# Patient Record
Sex: Male | Born: 1990
Health system: Southern US, Community
[De-identification: ages and names within clinical notes are randomized; demographics above are authoritative.]

## PROBLEM LIST (undated history)

## (undated) DIAGNOSIS — S86019A Strain of unspecified Achilles tendon, initial encounter: Secondary | ICD-10-CM

## (undated) DIAGNOSIS — J45909 Unspecified asthma, uncomplicated: Secondary | ICD-10-CM

## (undated) DIAGNOSIS — I10 Essential (primary) hypertension: Secondary | ICD-10-CM

## (undated) HISTORY — DX: Essential (primary) hypertension: I10

## (undated) HISTORY — DX: Unspecified asthma, uncomplicated: J45.909

## (undated) HISTORY — PX: NO PAST SURGERIES: SHX2092

---

## 2014-11-06 ENCOUNTER — Ambulatory Visit (INDEPENDENT_AMBULATORY_CARE_PROVIDER_SITE_OTHER): Payer: Self-pay | Admitting: Physician Assistant

## 2014-11-06 VITALS — BP 110/82 | HR 68 | Temp 98.0°F | Resp 17 | Ht 69.0 in | Wt 162.2 lb

## 2014-11-06 DIAGNOSIS — Z7689 Persons encountering health services in other specified circumstances: Secondary | ICD-10-CM

## 2014-11-06 DIAGNOSIS — Z8739 Personal history of other diseases of the musculoskeletal system and connective tissue: Secondary | ICD-10-CM

## 2014-11-06 DIAGNOSIS — Z0000 Encounter for general adult medical examination without abnormal findings: Secondary | ICD-10-CM
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
## 2015-09-02 ENCOUNTER — Emergency Department (HOSPITAL_COMMUNITY): Payer: Self-pay

## 2015-09-02 ENCOUNTER — Emergency Department (HOSPITAL_COMMUNITY)
Admission: EM | Admit: 2015-09-02 | Discharge: 2015-09-02 | Disposition: A | Discharge status: Home / Self Care | Payer: Self-pay | Attending: Emergency Medicine | Admitting: Emergency Medicine

## 2015-09-02 ENCOUNTER — Encounter (HOSPITAL_COMMUNITY): Payer: Self-pay | Admitting: Emergency Medicine

## 2015-09-02 DIAGNOSIS — J029 Acute pharyngitis, unspecified: Secondary | ICD-10-CM

## 2015-09-02 DIAGNOSIS — J45901 Unspecified asthma with (acute) exacerbation: Secondary | ICD-10-CM | POA: Insufficient documentation

## 2015-09-02 MED ORDER — DEXAMETHASONE SODIUM PHOSPHATE 10 MG/ML IJ SOLN: 10.0000 mg | Freq: Once | INTRAMUSCULAR | Status: DC

## 2015-09-02 MED ORDER — DEXAMETHASONE SODIUM PHOSPHATE 10 MG/ML IJ SOLN
10.0000 mg | Freq: Once | INTRAMUSCULAR | Status: DC
  Filled 2015-09-02: qty 1

## 2015-09-02 NOTE — Discharge Instructions (Signed)
1. Medications: usual home medications 2. Treatment: rest, drink plenty of fluids, take tylenol or ibuprofen for sore throat and fever control 3. Follow Up: Please followup with your primary doctor in 3 days for discussion of your diagnoses and further evaluation after today's visit; if you do not have a primary care doctor use the resource guide provided to find one; Return to the ER for high fevers, difficulty breathing or other concerning symptoms    Pharyngitis Pharyngitis is redness, pain, and swelling (inflammation) of your pharynx.  CAUSES  Pharyngitis is usually caused by infection. Most of the time, these infections are from viruses (viral) and are part of a cold. However, sometimes pharyngitis is caused by bacteria (bacterial). Pharyngitis can also be caused by allergies. Viral pharyngitis may be spread from person to person by coughing, sneezing, and personal items or utensils (cups, forks, spoons, toothbrushes). Bacterial pharyngitis may be spread from person to person by more intimate contact, such as kissing.  SIGNS AND SYMPTOMS  Symptoms of pharyngitis include:   Sore throat.   Tiredness (fatigue).   Low-grade fever.   Headache.  Joint pain and muscle aches.  Skin rashes.  Swollen lymph nodes.  Plaque-like film on throat or tonsils (often seen with bacterial pharyngitis). DIAGNOSIS  Your health care provider will ask you questions about your illness and your symptoms. Your medical history, along with a physical exam, is often all that is needed to diagnose pharyngitis. Sometimes, a rapid strep test is done. Other lab tests may also be done, depending on the suspected cause.  TREATMENT  Viral pharyngitis will usually get better in 3-4 days without the use of medicine. Bacterial pharyngitis is treated with medicines that kill germs (antibiotics).  HOME CARE INSTRUCTIONS   Drink enough water and fluids to keep your urine clear or pale yellow.   Only take  over-the-counter or prescription medicines as directed by your health care provider:   If you are prescribed antibiotics, make sure you finish them even if you start to feel better.   Do not take aspirin.   Get lots of rest.   Gargle with 8 oz of salt water ( tsp of salt per 1 qt of water) as often as every 1-2 hours to soothe your throat.   Throat lozenges (if you are not at risk for choking) or sprays may be used to soothe your throat. SEEK MEDICAL CARE IF:   You have large, tender lumps in your neck.  You have a rash.  You cough up green, yellow-brown, or bloody spit. SEEK IMMEDIATE MEDICAL CARE IF:   Your neck becomes stiff.  You drool or are unable to swallow liquids.  You vomit or are unable to keep medicines or liquids down.  You have severe pain that does not go away with the use of recommended medicines.  You have trouble breathing (not caused by a stuffy nose). MAKE SURE YOU:   Understand these instructions.  Will watch your condition.  Will get help right away if you are not doing well or get worse.   This information is not intended to replace advice given to you by your health care provider. Make sure you discuss any questions you have with your health care provider.   Document Released: 07/18/2005 Document Revised: 05/08/2013 Document Reviewed: 03/25/2013 Elsevier Interactive Patient Education 2016 [employer].    Emergency Department Resource Guide 1) Find a Doctor and Pay Out of Pocket Although you won't have to find out who is covered by  your insurance plan, it is a good idea to ask around and get recommendations. You will then need to call the office and see if the doctor you have chosen will accept you as a new patient and what types of options they offer for patients who are self-pay. Some doctors offer discounts or will set up payment plans for their patients who do not have insurance, but you will need to ask so you aren't surprised when you  get to your appointment.  2) Contact Your Local Health Department Not all health departments have doctors that can see patients for sick visits, but many do, so it is worth a call to see if yours does. If you don't know where your local health department is, you can check in your phone book. The CDC also has a tool to help you locate your state's health department, and many state websites also have listings of all of their local health departments.  3) Find a Walk-in Clinic If your illness is not likely to be very severe or complicated, you may want to try a walk in clinic. These are popping up all over the country in pharmacies, drugstores, and shopping centers. They're usually staffed by nurse practitioners or physician assistants that have been trained to treat common illnesses and complaints. They're usually fairly quick and inexpensive. However, if you have serious medical issues or chronic medical problems, these are probably not your best option.  No Primary Care Doctor: - Call Health Connect at  [phone] - they can help you locate a primary care doctor that  accepts your insurance, provides certain services, etc. - Physician Referral Service- [phone]  Chronic Pain Problems: Organization         Address  Phone   Notes  [facility]  [phone] Patients need to be referred by their primary care doctor.   Medication Assistance: Organization         Address  Phone   Notes  [facility] Med[facility] [address] [place] [place], [place] [address] [phone] --Must be a resident of [facility] -- Must have NO insurance coverage whatsoever (no Medicaid/ Medicare, etc.) -- The pt. MUST have a primary care doctor that directs their care regularly and follows them in the community   [facility]  [phone]   [employer]  [phone]    Agencies that provide inexpensive medical care: Organization         Address  Phone    Notes  [facility]  [phone]   [facility]    [phone]   [facility] [address] [place], [place] [address] [phone]   [facility] [address], [place] [phone]   Planned Parenthood    [phone]   [facility]    [phone]   [facility]  [address], [place] Phone:  [phone], Fax:  [phone] Hours of Operation:  9 am - 6 pm, M-F.  Also accepts Medicaid/Medicare and self-pay.  [facility]  [address], [place] Phone: [phone], Fax: [phone]. Hours of Operation:  8:30 am - 5:30 pm, M-F.  Also accepts Medicaid and self-pay.  [facility] [place] [address], [place] Phone: [phone]   [facility] [address] Natasha Bence [place], [place] [phone], Ext. [phone] Mondays &  Thursdays: 7-9 AM.  First 15 patients are seen on a first come, first serve basis.    Medicaid-accepting [facility] Providers:  Organization         Address  Phone   Notes  [facility] [address], [place] [phone] Also accepts self-pay patients.  [facility] [address] Laurell Josephs [place], [place]  [phone]   [facility] [address], [place] [phone]   [facility] [address], [place] [phone]   Renaye Rakers [address], [place]   [phone] Only accepts [place] Access [place] patients after they have their name applied to their card.   Self-Pay (no insurance) in [facility]:  Organization         Address  Phone   Notes  Sickle Cell Patients, [facility] Medicine [address] [place], [place] [phone]   [facility] [address] [place], [place] [phone]   [facility] [place]  [address], [place] [phone]   [facility]/Dr. Osei-Bonsu  [address], [place] or [address], [place] [phone] Phone number for both [place] and [place] locations is the same.  [facility] [address] [place] [phone]   [facility] [address], [place] or [address] [phone]   [facility] [address], [place] [phone], phone; [phone], fax Sees patients 1st and 3rd Saturday of every month.  Must not qualify for public or private insurance (i.e. Medicaid, Medicare, [place] Health Choice, Veterans' Benefits)  Household income should be no more than 200% of the poverty level The clinic cannot treat you if you are pregnant or think you are pregnant  Sexually transmitted diseases are not treated at the clinic.    Dental Care: Organization         Address  Phone  Notes  [facility] Department of [facility] [address] [place], [place] [phone] Accepts children up to age 20 who are enrolled in [place] or [place] Health Choice; pregnant women with a Medicaid card; and children who have applied for Medicaid or [place] Health Choice, but were declined, whose parents can pay a reduced fee at time of service.  [facility] Department of [facility]  [address], [place] [phone] Accepts children up to age 60 who are enrolled in [place] or [place] Health Choice; pregnant women with a Medicaid card; and children who have applied for Medicaid or [place] Health Choice, but were declined, whose parents can pay a reduced fee at time of service.  [facility]  [address] [place], [place] [phone] Patients are seen by appointment only. Walk-ins are not accepted. [facility] will see patients 77 years of age  and older. Monday - Tuesday (8am-5pm) Most Wednesdays (8:30-5pm) $30 per visit, cash only  [facility]  [address], [facility] [phone] Patients are seen by appointment only. Walk-ins are not accepted. [facility] will see patients 62 years of age and older. One Wednesday Evening (Monthly: Volunteer Based).  $30 per visit, cash only  [employer]  [phone] for adults; Children under  age 19, call Graduate Pediatric Dentistry at [phone]. Children aged 16-14, please call [phone] to request a pediatric application.  Dental services are provided in all areas of dental care including fillings, crowns and bridges, complete and partial dentures, implants, gum treatment, root canals, and extractions. Preventive care is also provided. Treatment is provided to both adults and children. Patients are selected via a lottery and there is often a waiting list.   [facility] [address], [place]  [phone] [email or web address]   [facility] [address] [place], [place] [phone], Ext. [phone] Second and Fourth Thursday of each month, opens at 6:30 AM; Clinic ends at 9 AM.  Patients are seen on a first-come first-served basis, and a limited number are seen during each clinic.   [facility]  [address] Ether Griffins [place], [place] [phone]   Eligibility Requirements You must have lived in [place], [place], or [place] counties for at least the last three months.   You cannot be eligible for state or federal sponsored [place], including [employer], [place], or [employer].   You generally cannot be eligible for healthcare insurance through your employer.    How to apply: Eligibility screenings are held every Tuesday and Wednesday afternoon from 1:00 pm until 4:00 pm. You do not need an appointment for the interview!  [facility] [address] [place], [place] [phone]   [facility] Health Department  [phone]   [facility] Health Department  [phone]   [facility]  [phone]    Behavioral Health Resources in the Community: Intensive Outpatient Programs Organization         Address  Phone  Notes  [facility] [address], [place], [place] [phone]   [facility] Outpatient [address] [place], [place] [phone]   [facility] [address], [place], [place]  [phone]   [facility] Health [address],  [place], [place] [phone] or [phone]   Substance Abuse Resources Organization         Address  Phone  Notes  [facility] Drug Services  [phone]   [facility]  [phone]   [facility] [place]  [phone]   Floydene Flock  [phone]   Residential & Outpatient Substance Abuse Program  [phone]   Psychological Services Organization         Address  Phone  Notes  [facility] Behavioral Health  [phone]   [facility] Services  [phone]   [facility] Mental Health [address] [place] [phone] or [phone]    Mobile Crisis Teams Organization         Address  Phone  Notes  [facility], Mobile Crisis Care Unit  [phone]   [facility]  [address] [place], [place] [phone]   Doristine Locks [address] [place] [phone]    Self-Help/Support Groups Organization         Address  Phone             Notes  [facility] [place] - variety of support groups  336- I7437963 Call for more information  Narcotics Anonymous (NA), [facility] [address], [employer] [place]  2 meetings at this location   Chief Executive Officer  Notes  [facility] [phone] [place]  Lynne Logan [place]  [phone]   [facility]  [address], [place] [address], [place], [place] [phone]   [facility] [address] [place], [place] [phone] Admissions: 8am-3pm M-F  [facility] [address]    [place], [place] [phone]   [facility] [address] [place], [place], [place] [phone]   [facility] [address]  [place], [place] [phone]   [facility] - Intensive Outpatient [address] Laurell Josephs [address], [place], [place] [phone]   [facility] ([facility] [address] [place].,  [place], [place] [phone] or [phone]   [facility] ([facility]) [address] [place], [place] [phone] Accepts Medicaid  [facility] [place] [address]  [place] [phone] Substance Abuse/Addiction Treatment   [facility] Organization         Address  Phone  Notes  [facility]  [phone]   Angie Fava, PhD [address] Ervin Knack [place], [place]   [phone] or [phone]   [facility] Behavioral   [address] [place], [place] [phone]   [facility] [address], [place], [place] [phone] Insurance/Medicaid/sponsorship through [facility] and Families [address]                                    [place], [place] [phone] Therapy/tele-psych/case  [facility] [address], [place] [phone]    Dr. Lolly Mustache  [phone]   [facility]  [employer] [facility] - Behavioral Health Services Dept. 1) [address] [place] 2) [address] [place] 3)  [address], [place] [phone]  9846113434   [facility] Child Abuse Hotline 878 111 1622 or [phone] (After Hours)

## 2015-09-02 NOTE — ED Notes (Signed)
Pt reports cold symptoms and sore throat x 2 days. Obvious  Red, inflamed and swollen tonsils. Pt reports shortness of breath, pt is hypertensive 149/108 . Pt does not take BP meds. Pt denies chest pain at this time.

## 2015-09-02 NOTE — ED Provider Notes (Signed)
CSN: [id number]     Arrival date & time 09/02/15  1714 History   First MD Initiated Contact with Patient 09/02/15 1724     Chief Complaint  Patient presents with  . Sore Throat  . Shortness of Breath     (Consider location/radiation/quality/duration/timing/severity/associated sxs/prior Treatment) The history is provided by the patient and medical records. No language interpreter was used.   Joseph Erickson is a 25 y.o. male  with a hx of asthma presents to the Emergency Department complaining of gradual, persistent, progressively worsening sore throat onset yesterday with onset of voice hoarseness this morning. Associated symptoms include intermittent cough and shortness of breath. No aggravating or alleviating factors. Patient reports that he feels the need to clear his throat often.  Pt denies fever, chills, difficult swallowing, neck pain, chest pain, wheezing, abdominal pain, nausea, vomiting, diarrhea, weakness, dizziness, syncope.  Patient reports he is up-to-date on his vaccines.   Past Medical History  Diagnosis Date  . Asthma    History reviewed. No pertinent past surgical history. No family history on file. Social History  Substance Use Topics  . Smoking status: Never Smoker   . Smokeless tobacco: Never Used  . Alcohol Use: 0.0 oz/week    0 Standard drinks or equivalent per week     Comment: rarely    Review of Systems  Constitutional: Negative for fever, diaphoresis, appetite change, fatigue and unexpected weight change.  HENT: Positive for sore throat. Negative for mouth sores.   Eyes: Negative for visual disturbance.  Respiratory: Negative for cough, chest tightness, shortness of breath and wheezing.   Cardiovascular: Negative for chest pain.  Gastrointestinal: Negative for nausea, vomiting, abdominal pain, diarrhea and constipation.  Endocrine: Negative for polydipsia, polyphagia and polyuria.  Genitourinary: Negative for dysuria, urgency, frequency and hematuria.   Musculoskeletal: Negative for back pain and neck stiffness.  Skin: Negative for rash.  Allergic/Immunologic: Negative for immunocompromised state.  Neurological: Negative for syncope, light-headedness and headaches.  Hematological: Does not bruise/bleed easily.  Psychiatric/Behavioral: Negative for sleep disturbance. The patient is not nervous/anxious.       Allergies  Review of patient's allergies indicates no known allergies.  Home Medications   Prior to Admission medications   Not on File   BP 134/88 mmHg  Pulse 78  Temp(Src) 98.1 F (36.7 C) (Oral)  Resp 20  SpO2 99% Physical Exam  Constitutional: He appears well-developed and well-nourished. No distress.  HENT:  Head: Normocephalic and atraumatic.  Right Ear: Tympanic membrane, external ear and ear canal normal.  Left Ear: Tympanic membrane, external ear and ear canal normal.  Nose: Nose normal. No mucosal edema or rhinorrhea.  Mouth/Throat: Uvula is midline and mucous membranes are normal. Mucous membranes are not dry. No trismus in the jaw. No uvula swelling. Posterior oropharyngeal erythema present. No oropharyngeal exudate, posterior oropharyngeal edema or tonsillar abscesses.  Posterior oropharynx with erythema but no edema or exudate on the tonsils Scattered vesicles along the posterior oropharynx without petechiae Epiglottis is visible but is without erythema or swelling  Eyes: Conjunctivae are normal.  Neck: Normal range of motion, full passive range of motion without pain and phonation normal. No tracheal tenderness, no spinous process tenderness and no muscular tenderness present. No rigidity. No erythema and normal range of motion present. No Brudzinski's sign and no Kernig's sign noted.  Range of motion without pain  No midline or paraspinal tenderness Hoarse phonation No stridor Handling secretions without difficulty No nuchal rigidity or meningeal signs  Cardiovascular: Normal  rate, regular rhythm,  normal heart sounds and intact distal pulses.   Pulses:      Radial pulses are 2+ on the right side, and 2+ on the left side.  Pulmonary/Chest: Effort normal and breath sounds normal. No stridor. No respiratory distress. He has no decreased breath sounds. He has no wheezes.  Equal chest expansion, clear and equal breath sounds without focal wheezes, rhonchi or rales  Abdominal: Soft. Bowel sounds are normal. He exhibits no distension.  Musculoskeletal: Normal range of motion.  Lymphadenopathy:       Head (right side): Submandibular and tonsillar adenopathy present. No submental, no preauricular, no posterior auricular and no occipital adenopathy present.       Head (left side): Submandibular and tonsillar adenopathy present. No submental, no preauricular, no posterior auricular and no occipital adenopathy present.    He has no cervical adenopathy.       Right cervical: No superficial cervical, no deep cervical and no posterior cervical adenopathy present.      Left cervical: No superficial cervical, no deep cervical and no posterior cervical adenopathy present.  Neurological: He is alert.  Alert and oriented Moves all extremities without ataxia  Skin: Skin is warm and dry. He is not diaphoretic.  Psychiatric: He has a normal mood and affect.  Nursing note and vitals reviewed.   ED Course  Procedures (including critical care time)  Imaging Review Dg Neck Soft Tissue  09/02/2015  CLINICAL DATA:  Globus sensation in the throat.  Visible epiglottis. EXAM: NECK SOFT TISSUES - 1+ VIEW COMPARISON:  None. FINDINGS: There is no evidence of retropharyngeal soft tissue swelling or epiglottic enlargement. The cervical airway is unremarkable and no radio-opaque foreign body identified. IMPRESSION: Negative. Electronically Signed   By: Delbert Phenix M.D.   On: 09/02/2015 18:38   Dg Chest 2 View  09/02/2015  CLINICAL DATA:  Sore throat.  Shortness of breath.  Dry cough. EXAM: CHEST  2 VIEW COMPARISON:   None. FINDINGS: The heart size and mediastinal contours are within normal limits. Both lungs are clear. The visualized skeletal structures are unremarkable. IMPRESSION: No active cardiopulmonary disease. Electronically Signed   By: Gaylyn Rong M.D.   On: 09/02/2015 17:48   I have personally reviewed and evaluated these images and lab results as part of my medical decision-making.    MDM   Final diagnoses:  Viral pharyngitis  Sore throat   Joseph Erickson presents with sore throat.  Pt afebrile without tonsillar exudate, tonsillar edema. Presents with no cervical lymphadenopathy, & minimal dysphagia; diagnosis of viral pharyngitis. Full range of motion of the neck without pain. Highly doubt RPA.  Patient reports concern about visible epiglottis. It is visualized my exam but is not erythematous or edematous. Patient with hoarse voice but no hot potato voice or fold voice. Handling secretions without difficulty. Able to drink water in the emergency department.   Complaint some of the neck shows no enlargement of retropharyngeal soft tissue epiglottis. No abx indicated. DC w symptomatic tx for pain  Pt does not appear dehydrated, but did discuss importance of water rehydration. Presentation non concerning for PTA or infxn spread to soft tissue. No trismus or uvula deviation. Specific return precautions discussed. Pt able to drink water in ED without difficulty with intact air way. Recommended PCP follow up.  The patient was discussed with and seen by Dr. Adela Lank who agrees with the treatment plan.     Dahlia Client Eilyn Polack, PA-C 09/02/15 1922  Melene Plan, DO 09/02/15  2034 

## 2015-09-02 NOTE — ED Notes (Signed)
Patient transported to X-ray 

## 2015-09-24 ENCOUNTER — Encounter (HOSPITAL_COMMUNITY): Payer: Self-pay | Admitting: Emergency Medicine

## 2015-09-24 ENCOUNTER — Emergency Department (HOSPITAL_COMMUNITY)
Admission: EM | Admit: 2015-09-24 | Discharge: 2015-09-24 | Disposition: A | Discharge status: Home / Self Care | Payer: Self-pay | Attending: Emergency Medicine | Admitting: Emergency Medicine

## 2015-09-24 DIAGNOSIS — Y9289 Other specified places as the place of occurrence of the external cause: Secondary | ICD-10-CM | POA: Insufficient documentation

## 2015-09-24 DIAGNOSIS — S86012A Strain of left Achilles tendon, initial encounter: Secondary | ICD-10-CM | POA: Insufficient documentation

## 2015-09-24 DIAGNOSIS — J45909 Unspecified asthma, uncomplicated: Secondary | ICD-10-CM | POA: Insufficient documentation

## 2015-09-24 DIAGNOSIS — W500XXA Accidental hit or strike by another person, initial encounter: Secondary | ICD-10-CM | POA: Insufficient documentation

## 2015-09-24 DIAGNOSIS — Y9367 Activity, basketball: Secondary | ICD-10-CM | POA: Insufficient documentation

## 2015-09-24 DIAGNOSIS — Y998 Other external cause status: Secondary | ICD-10-CM | POA: Insufficient documentation

## 2015-09-24 MED ORDER — HYDROCODONE-ACETAMINOPHEN 5-325 MG PO TABS
1.0000 | ORAL_TABLET | Freq: Once | ORAL | Status: AC
  Administered 2015-09-24: 1 via ORAL
  Filled 2015-09-24: qty 1

## 2015-09-24 MED ORDER — IBUPROFEN 800 MG PO TABS
800.0000 mg | ORAL_TABLET | Freq: Once | ORAL | Status: DC
  Filled 2015-09-24: qty 1

## 2015-09-24 MED ORDER — HYDROCODONE-ACETAMINOPHEN 5-325 MG PO TABS: 1.0000 | ORAL_TABLET | Freq: Four times a day (QID) | ORAL | Status: DC | PRN

## 2015-09-24 NOTE — ED Provider Notes (Signed)
  Face-to-face evaluation   History: He injured his left ankle, when someone stepped on it as he was playing basketball.  Physical exam: Alert, calm, cooperative. Left ankle tender posteriorly with defect consistent with Achilles tendon rupture. Formal Thompson test is positive for Achilles rupture, left.  Medical screening examination/treatment/procedure(s) were conducted as a shared visit with non-physician practitioner(s) and myself.  I personally evaluated the patient during the encounter  Mancel Bale, MD 09/24/15 1737

## 2015-09-24 NOTE — Discharge Instructions (Signed)
Please call Dr. Eliberto Ivory office to schedule an appointment for Monday. In the meantime I will give you a prescription for Vicodin. If you do not need something so strong you may take the ibuprofen you have at home (  every 8 hours). Return to the ER for new or worsening symptoms. Complete Achilles Tendon Rupture An Achilles tendon rupture is an injury in which the tough, cord-like band that attaches the lower muscles of your leg to your heel (Achilles tendon) tears (ruptures). In a complete Achilles tendon rupture, you are not able to stand up on the toes of the injured leg. CAUSES A tendon may rupture if it is weakened or weakening (degenerative) and a sudden stress is applied to it. Weakening or degeneration of a tendon may be caused by:  Recurrent injuries, such as those causing Achilles tendinitis.  Damaged tendons.  Aging.  Vascular disease of the tendon. SIGNS AND SYMPTOMS  Feeling as if you were struck violently in the back of the ankle.  Hearing a "pop" and experiencing severe, sudden (acute) pain; however, the absence of pain does not mean there was not a rupture. DIAGNOSIS A physical exam is usually all that is needed to diagnose an Achilles tendon rupture. During the exam, your health care provider will touch the tendon and the structures around it. You may be asked to lie on your stomach or kneel on a chair while your health care provider squeezes your calf muscle. You most likely have a ruptured tendon if your foot does not flex.  Sometimes tests are performed. These may include:  An ultrasound. This allows quick confirmation of the diagnosis.  An X-ray.  An MRI. TREATMENT  A complete Achilles tendon rupture is treated with surgery. You will have a cast while the injury heals (this usually takes 6-10 weeks). Before your surgery, treatment may consist of:  Ice applied to the area.  Pain-relieving medicines.  Rest.  Crutches.  Keeping the ankle from moving  (immobilization), usually with a splint. HOME CARE INSTRUCTIONS   Apply ice to the injured area:   Put ice in a plastic bag.   Place a towel between your skin and the bag.   Leave the ice on for 20 minutes, 2-3 times a day.   Use crutches and move about only as instructed.   Keep the leg elevated above the level of the heart (the center of the chest) at all times when not using the bathroom. Do not dangle the leg over a chair, couch, or bed. When lying down, elevate your leg on a few pillows. Elevation prevents swelling and reduces pain.  Avoid use of the injured area other than gentle range of motion of the toes.  Do not drive a car until your health care provider specifically tells you it is safe to do so.  Take all medicines as directed by your health care provider.  Keep all follow-up visits as directed by your health care provider. SEEK MEDICAL CARE IF:   Your pain and swelling increase, or your pain is not controlled by medicines.   You have new, unexplained symptoms, or your symptoms get worse.  You cannot move your toes or foot.  You develop warmth and swelling in your foot.  You have an unexplained fever. MAKE SURE YOU:   Understand these instructions.  Will watch your condition.  Will get help right away if you are not doing well or get worse.   This information is not intended to replace advice given  to you by your health care provider. Make sure you discuss any questions you have with your health care provider.   Document Released: 04/27/2005 Document Revised: 12/02/2014 Document Reviewed: 03/08/2013 Elsevier Interactive Patient Education 2016 [employer]  Achilles Tendon Repair Your Achilles tendon is the strong, rope-like cord of tissue that connects your lower leg muscles to your heel. If the Achilles tendon tears (ruptures), you may have surgery to reconnect the ends of the tendon. This surgery is called Achilles tendon repair.  Achilles tendon  repair may be done at a hospital or at an outpatient surgery center. It takes from 30 minutes to 1 hour to complete. Most people who have the surgery are able to go home the same day as the surgery. The surgery is usually successful, but the recovery period can be long. LET [facility] CARE PROVIDER KNOW ABOUT:  Any allergies you have.  All medicines you are taking, including vitamins, herbs, eye drops, creams, and over-the-counter medicines.  Previous problems you or members of your family have had with the use of anesthetics.  Any blood disorders you have.  Previous surgeries you have had.  Medical conditions you have, including any skin conditions you develop before surgery. RISKS AND COMPLICATIONS Generally, this is a safe procedure. However, as with any procedure, problems can occur. Possible problems include:  Reactions to anesthetics.  Infection.  Bleeding.  Delayed healing.  Scarring.  Nerve damage causing numbness.  Re-rupture of your tendon (rare). BEFORE THE PROCEDURE  Discuss the risks and benefits of the surgery with your health care provider.  Ask your health care provider if you can take your regular medicines with a small sip of water on the morning of your surgery.  Be careful not to injure the skin on your leg.  Shower or bathe the night before the surgery or the morning of the surgery.  Do not eat or drink anything for 6-8 hours before your surgery or as directed by your health care provider.  If you smoke, quit before your surgery. Smoking makes it harder for your body to heal. PROCEDURE  A numbing medicine (local anesthetic) will be injected into the area around your ankle and lower leg.  Your lower leg will be cleaned and draped for surgery.  You will be given medicine that makes you sleep (general anesthetic).  The surgeon will make a cut (incision) over your Achilles tendon.  The torn ends of your tendon will be stitched back  together.  The incisions will be closed with stitches or staples.  A bandage will be applied. AFTER THE PROCEDURE  After the medicine wears off, you will be taken to the recovery area and monitored closely.  You will feel some pain when the numbing medicine wears off. Your health care provider will prescribe pain medicine for you to take at home.  Your leg may be put in a cast or splint.  Use crutches or another type of walking aid to keep weight off your leg.  You will be allowed to go home once you are awake, stable, able to drink, and have no other problems.   This information is not intended to replace advice given to you by your health care provider. Make sure you discuss any questions you have with your health care provider.   Document Released: 07/23/2013 Document Reviewed: 07/23/2013 Elsevier Interactive Patient Education [employer]

## 2015-09-24 NOTE — ED Notes (Signed)
Pt c/o left posterior leg injury, tenderness to calf on palpation, significant calf pain with dorsiflexion of foot. No point tenderness to knee, ankle, foot.

## 2015-09-24 NOTE — ED Provider Notes (Signed)
CSN: [id number]     Arrival date & time 09/24/15  1019 History   First MD Initiated Contact with Patient 09/24/15 1141     Chief Complaint  Patient presents with  . Leg Injury    HPI   Joseph Erickson is an 25 y.o. male with history of asthma who presents to the ED for evaluation of left leg injury sustained two hours PTA. Pt states he was playing basketball when he went for a rebound and one of his teammates kicked him accidentally in his posterior calcaneal region. States he had immediate onset sharp severe pain that radiated up his calf. He states he did not fall. States initially he was able to bear weight though it cause significant pain in his calf and states it felt "weird" like he was "walking on a downhill slope." In the ED now he cannot bear weight. He cannot plantar flex. He does still have dorsiflexion ability. States his pain is mostly in his calf and rates it 8-9/10. He has not tried anything for his symptoms at this time.   Past Medical History  Diagnosis Date  . Asthma    History reviewed. No pertinent past surgical history. History reviewed. No pertinent family history. Social History  Substance Use Topics  . Smoking status: Never Smoker   . Smokeless tobacco: Never Used  . Alcohol Use: 0.0 oz/week    0 Standard drinks or equivalent per week     Comment: rarely    Review of Systems  All other systems reviewed and are negative.     Allergies  Review of patient's allergies indicates no known allergies.  Home Medications   Prior to Admission medications   Not on File   BP 132/74 mmHg  Pulse 72  Temp(Src) 98 F (36.7 C) (Oral)  Resp 16  SpO2 100% Physical Exam  Constitutional: He is oriented to person, place, and time. No distress.  HENT:  Head: Atraumatic.  Right Ear: External ear normal.  Left Ear: External ear normal.  Nose: Nose normal.  Eyes: Conjunctivae are normal. No scleral icterus.  Neck: Normal range of motion. Neck supple.  Cardiovascular:  Normal rate and regular rhythm.   Pulmonary/Chest: Effort normal. No respiratory distress. He exhibits no tenderness.  Abdominal: Soft. He exhibits no distension. There is no tenderness.  Musculoskeletal:  Left calf diffusely TTP. There is a defect where achilles tendon should be in posterior calcaneal region. Thompson test positive (minimal plantar flexion) with pt prone on exam bed. Pt cannot actively plantar flex. He can dorsiflex. 2+ distal pulses and good cap refill. Cannot bear weight or ambulate unassisted.  Neurological: He is alert and oriented to person, place, and time.  Skin: Skin is warm and dry. He is not diaphoretic.  Psychiatric: He has a normal mood and affect. His behavior is normal.  Nursing note and vitals reviewed.   ED Course  Procedures (including critical care time) Labs Review Labs Reviewed - No data to display  Imaging Review No results found. I have personally reviewed and evaluated these images and lab results as part of my medical decision-making.   EKG Interpretation None      MDM   Final diagnoses:  Achilles tendon rupture, left, initial encounter    Suspect acute achilles tendon rupture. Pt seen in conjunction with attending MD Effie Shy who agrees. Will page ortho.   Spoke to Dr. Magnus Ivan who advised CAM boot with crutches, f/u in clinic on Monday. Appreciate recs. CAM boot and crutches  given. Rx given for Vicodin. Pt has ibuprofen at home. Pt verbalized he will call to schedule ortho f/u. ER return precautions given    Carlene Coria, PA-C 09/24/15 1302  Mancel Bale, MD 09/24/15 [phone]

## 2015-09-30 ENCOUNTER — Other Ambulatory Visit: Payer: Self-pay | Admitting: Physician Assistant

## 2015-09-30 ENCOUNTER — Encounter (HOSPITAL_COMMUNITY): Payer: Self-pay | Admitting: *Deleted

## 2015-09-30 ENCOUNTER — Other Ambulatory Visit (HOSPITAL_COMMUNITY): Payer: Self-pay | Admitting: *Deleted

## 2015-10-02 ENCOUNTER — Encounter (HOSPITAL_COMMUNITY): Payer: Self-pay | Admitting: *Deleted

## 2015-10-02 ENCOUNTER — Ambulatory Visit (HOSPITAL_COMMUNITY): Payer: Self-pay | Admitting: Anesthesiology

## 2015-10-02 ENCOUNTER — Encounter (HOSPITAL_COMMUNITY)
Admission: RE | Disposition: A | Discharge status: Home / Self Care | Payer: Self-pay | Source: Ambulatory Visit | Attending: Orthopaedic Surgery

## 2015-10-02 ENCOUNTER — Ambulatory Visit (HOSPITAL_COMMUNITY)
Admission: RE | Admit: 2015-10-02 | Discharge: 2015-10-02 | Disposition: A | Discharge status: Home / Self Care | Payer: Self-pay | Source: Ambulatory Visit | Attending: Orthopaedic Surgery | Admitting: Orthopaedic Surgery

## 2015-10-02 DIAGNOSIS — X58XXXA Exposure to other specified factors, initial encounter: Secondary | ICD-10-CM | POA: Insufficient documentation

## 2015-10-02 DIAGNOSIS — S86012A Strain of left Achilles tendon, initial encounter: Secondary | ICD-10-CM

## 2015-10-02 DIAGNOSIS — Y9367 Activity, basketball: Secondary | ICD-10-CM | POA: Insufficient documentation

## 2015-10-02 HISTORY — DX: Strain of unspecified achilles tendon, initial encounter: S86.019A

## 2015-10-02 HISTORY — PX: ACHILLES TENDON SURGERY: SHX542

## 2015-10-02 LAB — HEMOGLOBIN: Hemoglobin: 13.8 g/dL (ref 13.0–17.0)

## 2015-10-02 SURGERY — REPAIR, TENDON, ACHILLES
Anesthesia: General | Site: Leg Lower | Laterality: Left

## 2015-10-02 MED ORDER — SUGAMMADEX SODIUM 200 MG/2ML IV SOLN
INTRAVENOUS | Status: DC | PRN
  Administered 2015-10-02: 175 mg via INTRAVENOUS

## 2015-10-02 MED ORDER — ONDANSETRON HCL 4 MG/2ML IJ SOLN
INTRAMUSCULAR | Status: AC
  Filled 2015-10-02: qty 2

## 2015-10-02 MED ORDER — PROPOFOL 10 MG/ML IV BOLUS
INTRAVENOUS | Status: AC
  Filled 2015-10-02: qty 20

## 2015-10-02 MED ORDER — BUPIVACAINE HCL (PF) 0.25 % IJ SOLN
INTRAMUSCULAR | Status: AC
  Filled 2015-10-02: qty 30

## 2015-10-02 MED ORDER — ROCURONIUM BROMIDE 100 MG/10ML IV SOLN
INTRAVENOUS | Status: DC | PRN
  Administered 2015-10-02: 5 mg via INTRAVENOUS

## 2015-10-02 MED ORDER — ASPIRIN EC 325 MG PO TBEC: 325.0000 mg | DELAYED_RELEASE_TABLET | Freq: Every day | ORAL | Status: DC

## 2015-10-02 MED ORDER — LIDOCAINE HCL (CARDIAC) 20 MG/ML IV SOLN
INTRAVENOUS | Status: DC | PRN
  Administered 2015-10-02: 50 mg via INTRAVENOUS

## 2015-10-02 MED ORDER — LACTATED RINGERS IV SOLN: INTRAVENOUS | Status: DC

## 2015-10-02 MED ORDER — HYDROMORPHONE HCL 1 MG/ML IJ SOLN
0.2500 mg | INTRAMUSCULAR | Status: DC | PRN
  Administered 2015-10-02: 0.5 mg via INTRAVENOUS

## 2015-10-02 MED ORDER — MEPERIDINE HCL 50 MG/ML IJ SOLN
6.2500 mg | INTRAMUSCULAR | Status: DC | PRN
  Administered 2015-10-02: 6.25 mg via INTRAVENOUS

## 2015-10-02 MED ORDER — PROPOFOL 10 MG/ML IV BOLUS
INTRAVENOUS | Status: DC | PRN
  Administered 2015-10-02: 200 mg via INTRAVENOUS

## 2015-10-02 MED ORDER — BUPIVACAINE HCL (PF) 0.5 % IJ SOLN
INTRAMUSCULAR | Status: AC
  Filled 2015-10-02: qty 30

## 2015-10-02 MED ORDER — MIDAZOLAM HCL 5 MG/5ML IJ SOLN
INTRAMUSCULAR | Status: DC | PRN
  Administered 2015-10-02: 2 mg via INTRAVENOUS

## 2015-10-02 MED ORDER — CEFAZOLIN SODIUM-DEXTROSE 2-3 GM-% IV SOLR
2.0000 g | INTRAVENOUS | Status: AC
  Administered 2015-10-02: 2 g via INTRAVENOUS

## 2015-10-02 MED ORDER — DEXAMETHASONE SODIUM PHOSPHATE 10 MG/ML IJ SOLN
INTRAMUSCULAR | Status: DC | PRN
  Administered 2015-10-02: 10 mg via INTRAVENOUS

## 2015-10-02 MED ORDER — OXYCODONE HCL 5 MG/5ML PO SOLN
5.0000 mg | Freq: Once | ORAL | Status: DC | PRN
  Filled 2015-10-02: qty 5

## 2015-10-02 MED ORDER — MIDAZOLAM HCL 2 MG/2ML IJ SOLN
INTRAMUSCULAR | Status: AC
  Filled 2015-10-02: qty 2

## 2015-10-02 MED ORDER — SUCCINYLCHOLINE CHLORIDE 20 MG/ML IJ SOLN
INTRAMUSCULAR | Status: DC | PRN
  Administered 2015-10-02: 100 mg via INTRAVENOUS

## 2015-10-02 MED ORDER — LIDOCAINE HCL (CARDIAC) 20 MG/ML IV SOLN
INTRAVENOUS | Status: AC
  Filled 2015-10-02: qty 5

## 2015-10-02 MED ORDER — HYDROMORPHONE HCL 1 MG/ML IJ SOLN
INTRAMUSCULAR | Status: DC | PRN
  Administered 2015-10-02 (×2): 1 mg via INTRAVENOUS

## 2015-10-02 MED ORDER — ROCURONIUM BROMIDE 100 MG/10ML IV SOLN
INTRAVENOUS | Status: AC
  Filled 2015-10-02: qty 1

## 2015-10-02 MED ORDER — HYDROMORPHONE HCL 1 MG/ML IJ SOLN
INTRAMUSCULAR | Status: AC
  Filled 2015-10-02: qty 1

## 2015-10-02 MED ORDER — FENTANYL CITRATE (PF) 100 MCG/2ML IJ SOLN
INTRAMUSCULAR | Status: DC | PRN
  Administered 2015-10-02 (×2): 50 ug via INTRAVENOUS
  Administered 2015-10-02: 100 ug via INTRAVENOUS
  Administered 2015-10-02: 50 ug via INTRAVENOUS

## 2015-10-02 MED ORDER — FENTANYL CITRATE (PF) 250 MCG/5ML IJ SOLN
INTRAMUSCULAR | Status: AC
  Filled 2015-10-02: qty 5

## 2015-10-02 MED ORDER — MEPERIDINE HCL 50 MG/ML IJ SOLN
INTRAMUSCULAR | Status: AC
  Filled 2015-10-02: qty 1

## 2015-10-02 MED ORDER — OXYCODONE HCL 5 MG PO TABS: 5.0000 mg | ORAL_TABLET | Freq: Once | ORAL | Status: DC | PRN

## 2015-10-02 MED ORDER — LACTATED RINGERS IV SOLN
INTRAVENOUS | Status: DC | PRN
  Administered 2015-10-02: 15:00:00 via INTRAVENOUS

## 2015-10-02 MED ORDER — SUGAMMADEX SODIUM 200 MG/2ML IV SOLN
INTRAVENOUS | Status: AC
  Filled 2015-10-02: qty 2

## 2015-10-02 MED ORDER — ONDANSETRON HCL 4 MG/2ML IJ SOLN
INTRAMUSCULAR | Status: DC | PRN
  Administered 2015-10-02: 4 mg via INTRAVENOUS

## 2015-10-02 MED ORDER — DEXAMETHASONE SODIUM PHOSPHATE 10 MG/ML IJ SOLN
INTRAMUSCULAR | Status: AC
  Filled 2015-10-02: qty 1

## 2015-10-02 MED ORDER — BUPIVACAINE-EPINEPHRINE (PF) 0.5% -1:200000 IJ SOLN
INTRAMUSCULAR | Status: AC
  Filled 2015-10-02: qty 30

## 2015-10-02 MED ORDER — CHLORHEXIDINE GLUCONATE 4 % EX LIQD: 60.0000 mL | Freq: Once | CUTANEOUS | Status: DC

## 2015-10-02 MED ORDER — BUPIVACAINE-EPINEPHRINE (PF) 0.5% -1:200000 IJ SOLN
INTRAMUSCULAR | Status: DC | PRN
  Administered 2015-10-02: 30 mL via PERINEURAL

## 2015-10-02 MED ORDER — HYDROMORPHONE HCL 2 MG/ML IJ SOLN
INTRAMUSCULAR | Status: AC
  Filled 2015-10-02: qty 1

## 2015-10-02 MED ORDER — CEFAZOLIN SODIUM-DEXTROSE 2-3 GM-% IV SOLR
INTRAVENOUS | Status: AC
  Filled 2015-10-02: qty 50

## 2015-10-02 MED ORDER — OXYCODONE-ACETAMINOPHEN 5-325 MG PO TABS: 1.0000 | ORAL_TABLET | ORAL | Status: DC | PRN

## 2015-10-02 SURGICAL SUPPLY — 51 items
BAG ZIPLOCK 12X15 (MISCELLANEOUS) IMPLANT
BANDAGE ACE 4X5 VEL STRL LF (GAUZE/BANDAGES/DRESSINGS) ×3 IMPLANT
BANDAGE ACE 6X5 VEL STRL LF (GAUZE/BANDAGES/DRESSINGS) ×3 IMPLANT
BANDAGE ESMARK 6X9 LF (GAUZE/BANDAGES/DRESSINGS) ×1 IMPLANT
BNDG ESMARK 6X9 LF (GAUZE/BANDAGES/DRESSINGS) ×3
CUFF TOURN SGL QUICK 34 (TOURNIQUET CUFF) ×2
CUFF TRNQT CYL 34X4X40X1 (TOURNIQUET CUFF) ×1 IMPLANT
DRAPE U-SHAPE 47X51 STRL (DRAPES) ×3 IMPLANT
DRSG PAD ABDOMINAL 8X10 ST (GAUZE/BANDAGES/DRESSINGS) ×3 IMPLANT
DURAPREP 26ML APPLICATOR (WOUND CARE) ×3 IMPLANT
ELECT REM PT RETURN 9FT ADLT (ELECTROSURGICAL) ×3
ELECTRODE REM PT RTRN 9FT ADLT (ELECTROSURGICAL) ×1 IMPLANT
GAUZE SPONGE 4X4 12PLY STRL (GAUZE/BANDAGES/DRESSINGS) ×3 IMPLANT
GAUZE SPONGE 4X4 16PLY XRAY LF (GAUZE/BANDAGES/DRESSINGS) ×3 IMPLANT
GAUZE XEROFORM 1X8 LF (GAUZE/BANDAGES/DRESSINGS) ×3 IMPLANT
GLOVE BIO SURGEON STRL SZ7.5 (GLOVE) ×3 IMPLANT
GLOVE BIOGEL PI IND STRL 7.0 (GLOVE) ×1 IMPLANT
GLOVE BIOGEL PI IND STRL 8 (GLOVE) ×2 IMPLANT
GLOVE BIOGEL PI INDICATOR 7.0 (GLOVE) ×2
GLOVE BIOGEL PI INDICATOR 8 (GLOVE) ×4
GLOVE ECLIPSE 8.0 STRL XLNG CF (GLOVE) ×3 IMPLANT
GOWN STRL REUS W/TWL XL LVL3 (GOWN DISPOSABLE) ×6 IMPLANT
MANIFOLD NEPTUNE II (INSTRUMENTS) ×3 IMPLANT
NEEDLE HYPO 22GX1.5 SAFETY (NEEDLE) ×3 IMPLANT
NEEDLE MAYO CATGUT SZ4 (NEEDLE) IMPLANT
PACK ORTHO EXTREMITY (CUSTOM PROCEDURE TRAY) ×3 IMPLANT
PAD CAST 4YDX4 CTTN HI CHSV (CAST SUPPLIES) ×1 IMPLANT
PADDING CAST ABS 4INX4YD NS (CAST SUPPLIES) ×2
PADDING CAST ABS 6INX4YD NS (CAST SUPPLIES) ×2
PADDING CAST ABS COTTON 4X4 ST (CAST SUPPLIES) ×1 IMPLANT
PADDING CAST ABS COTTON 6X4 NS (CAST SUPPLIES) ×1 IMPLANT
PADDING CAST COTTON 4X4 STRL (CAST SUPPLIES) ×2
PADDING CAST COTTON 6X4 STRL (CAST SUPPLIES) ×3 IMPLANT
POSITIONER SURGICAL ARM (MISCELLANEOUS) ×3 IMPLANT
SPLINT PLASTER CAST XFAST 5X30 (CAST SUPPLIES) ×1 IMPLANT
SPLINT PLASTER XFAST SET 5X30 (CAST SUPPLIES) ×2
SPONGE LAP 4X18 X RAY DECT (DISPOSABLE) ×6 IMPLANT
STOCKINETTE 6  STRL (DRAPES) ×2
STOCKINETTE 6 STRL (DRAPES) ×1 IMPLANT
SUT ETHIBOND NAB CT1 #1 30IN (SUTURE) IMPLANT
SUT ETHILON 2 0 PS N (SUTURE) ×6 IMPLANT
SUT FIBERWIRE #2 38 T-5 BLUE (SUTURE) ×6
SUT NYLON 3 0 (SUTURE) ×6 IMPLANT
SUT VIC AB 0 CT1 36 (SUTURE) ×3 IMPLANT
SUT VIC AB 1 CT1 36 (SUTURE) ×3 IMPLANT
SUT VIC AB 2-0 CT1 27 (SUTURE) ×2
SUT VIC AB 2-0 CT1 TAPERPNT 27 (SUTURE) ×1 IMPLANT
SUTURE FIBERWR #2 38 T-5 BLUE (SUTURE) ×2 IMPLANT
SYR 20CC LL (SYRINGE) ×3 IMPLANT
TOWEL OR 17X26 10 PK STRL BLUE (TOWEL DISPOSABLE) ×3 IMPLANT
WATER STERILE IRR 1000ML POUR (IV SOLUTION) ×6 IMPLANT

## 2015-10-02 NOTE — Anesthesia Postprocedure Evaluation (Signed)
Anesthesia Post Note  Patient: Joseph Erickson  Procedure(s) Performed: Procedure(s) (LRB): LEFT ACHILLES TENDON REPAIR (Left)  Patient location during evaluation: PACU Anesthesia Type: General Level of consciousness: awake and alert Pain management: pain level controlled Vital Signs Assessment: post-procedure vital signs reviewed and stable Respiratory status: spontaneous breathing, nonlabored ventilation, respiratory function stable and patient connected to face mask oxygen Cardiovascular status: blood pressure returned to baseline and stable Postop Assessment: no signs of nausea or vomiting Anesthetic complications: no    Last Vitals:  Filed Vitals:   10/02/15 1525 10/02/15 1730  BP: 140/97 152/99  Pulse: 67 98  Temp:    Resp: 14 11    Last Pain:  Filed Vitals:   10/02/15 1734  PainSc: 5                  Terria Deschepper A

## 2015-10-02 NOTE — Progress Notes (Signed)
Assisted Dr. Sheldon Silvanavid Crews with left, ultrasound guided, popliteal block. Side rails up, monitors on throughout procedure. See vital signs in flow sheet. Tolerated Procedure well.

## 2015-10-02 NOTE — Op Note (Signed)
[name]:  Leadbetter, Jowan               ACCOUNT NO.:  [id number]  MEDICAL RECORD NO.:  [id number]  LOCATION:  [facility]                         [facility]  PHYSICIAN:  Vanita PandaChristopher Y. Magnus IvanBlackman, M.D.DATE OF BIRTH:  1990-11-27  DATE OF PROCEDURE:  10/02/2015 DATE OF DISCHARGE:                              OPERATIVE REPORT   PREOPERATIVE DIAGNOSIS:  Acute left Achilles tendon rupture.  POSTOPERATIVE DIAGNOSIS:  Acute left Achilles tendon rupture.  PROCEDURE:  Direct primary repair of left acute Achilles tendon rupture.  SURGEON:  Vanita PandaChristopher Y. Magnus IvanBlackman, MD  ASSISTANT:  Richardean CanalGilbert Clark, PA-C  ANESTHESIA: 1. Regional left lower extremity block. 2. General.  TOURNIQUET TIME:  Less than 30 minutes.  BLOOD LOSS:  Minimal.  COMPLICATIONS:  None.  ANTIBIOTICS:  2 g IV Ancef.  INDICATIONS:  Joseph Hecksaiah is a 25 year old, who was playing basketball last week, and he got kicked the ankle and felt a pop.  He had obvious Achilles tendon complete rupture on exam.  He is now presenting for definitive repair.  He understands the risks and benefits of surgery and does wish to proceed given the profound pain he is having as well as the weakness of the Achilles tendon and thank you so much, 25 years old, not a diabetic, and nonsmoker, and otherwise very healthy.  PROCEDURE DESCRIPTION:  After informed consent was obtained, appropriate left leg was marked.  Anesthesia obtained with regional anesthesia.  He was then brought to the operating room.  General anesthesia was obtained while he was on a stretcher.  A nonsterile tourniquet was placed around his upper left thigh.  The knee was turned into a prone position on the operating table with appropriate positioning of the head and neck and padding his abdomen, torso, and waist.  His left operative leg was then prepped and draped from the toes down up to the knee with DuraPrep and sterile drapes.  A time-out was called.  He was identified as  correct patient, correct left ankle.  We then used Esmarch to wrap out the ankle and leg and tourniquet was inflated to 300 mm of pressure.  We then made a midline incision over the Achilles and just medial and dissected down to the Achilles itself.  We opened up the paratenon and found effusion and hematoma and a complete rupture of the Achilles.  We were able to freshen up both stumps of the Achilles and then used a #2 FiberWire suture in a Krackow format to secure the Achilles.  After we put these together, we over sewed with #1 Vicryl suture.  We were then able to close the paratenon with interrupted 0 Vicryl.  We then closed the deep tissue with 0 Vicryl followed by 2-0 Vicryl in subcutaneous tissue, interrupted 2-0 nylon on the skin.  Xeroform and well-padded sterile dressing was applied and we placed his foot in a plantar flexed position and a plaster splint.  He was then rolled back into supine position, awakened, extubated, and taken to recovery room in stable condition. All final counts were correct.  There were no complications noted.  Of note, Richardean CanalGilbert Clark, PA-C assisted during this case.     Vanita Pandahristopher Y. Magnus IvanBlackman, M.D.  CYB/MEDQ  D:  10/02/2015  T:  10/02/2015  Job:  [id number]

## 2015-10-02 NOTE — Anesthesia Procedure Notes (Addendum)
Anesthesia Regional Block:  Popliteal block  Pre-Anesthetic Checklist: ,, timeout performed, Correct Patient, Correct Site, Correct Laterality, Correct Procedure, Correct Position, site marked, Risks and benefits discussed,  Surgical consent,  Pre-op evaluation,  At surgeon's request and post-op pain management  Laterality: Left and Lower  Prep: chloraprep       Needles:  Injection technique: Single-shot  Needle Type: Echogenic Needle     Needle Length: 9cm 9 cm Needle Gauge: 21 and 21 G    Additional Needles:  Procedures: ultrasound guided (picture in chart) Popliteal block Narrative:  Start time: 10/02/2015 3:15 PM End time: 10/02/2015 3:20 PM Injection made incrementally with aspirations every 5 mL.  Performed by: Personally  Anesthesiologist: CREWS, DAVID   Procedure Name: Intubation Date/Time: 10/02/2015 3:57 PM Performed by: Enriqueta ShutterWILLIFORD, Reeda Soohoo D Pre-anesthesia Checklist: Patient identified, Emergency Drugs available, Suction available and Patient being monitored Patient Re-evaluated:Patient Re-evaluated prior to inductionOxygen Delivery Method: Circle System Utilized Preoxygenation: Pre-oxygenation with 100% oxygen Intubation Type: IV induction Ventilation: Mask ventilation without difficulty Laryngoscope Size: Mac and 4 Grade View: Grade II Tube type: Oral Tube size: 7.5 mm Number of attempts: 1 Airway Equipment and Method: Stylet and Oral airway Placement Confirmation: ETT inserted through vocal cords under direct vision,  positive ETCO2 and breath sounds checked- equal and bilateral Secured at: 22 cm Tube secured with: Tape Dental Injury: Teeth and Oropharynx as per pre-operative assessment       Left Popliteal image

## 2015-10-02 NOTE — Transfer of Care (Signed)
Immediate Anesthesia Transfer of Care Note  Patient: Joseph Erickson  Procedure(s) Performed: Procedure(s): LEFT ACHILLES TENDON REPAIR (Left)  Patient Location: PACU  Anesthesia Type:General  Level of Consciousness: awake, alert  and oriented  Airway & Oxygen Therapy: Patient Spontanous Breathing and Patient connected to face mask oxygen  Post-op Assessment: Report given to RN and Post -op Vital signs reviewed and stable  Post vital signs: Reviewed and stable  Last Vitals:  Filed Vitals:   10/02/15 1524 10/02/15 1525  BP:  140/97  Pulse: 68 67  Temp:    Resp: 11 14    Complications: No apparent anesthesia complications

## 2015-10-02 NOTE — Discharge Instructions (Signed)
No weight on your left ankle at all. Keep splint clean and dry. Take a full-strength 325 mg aspirin daily.  Achilles Tendon Repair, Care After Refer to this sheet in the next few weeks. These instructions provide you with information on caring for yourself after your procedure. Your health care provider may also give you more specific instructions. Your treatment has been planned according to current medical practices, but problems sometimes occur. Call your health care provider if you have any problems or questions after your procedure. WHAT TO EXPECT AFTER THE PROCEDURE  Your lower leg may feel numb for several hours.  You may feel pain when the numbing medicine wears off.  Most people can start to walk normally in about 6 weeks. It may be 6 months before you can do all your regular activities. Complete recovery can take a year or longer. HOME CARE INSTRUCTIONS  Take pain medicines as directed by your health care provider.  Do not take over-the-counter medicines unless your health care provider says it is okay.  Do not walk on or put weight on your injured leg.  Keep your cast or splint dry while bathing.  Use crutches or another walking aid.  When you are resting, keep your leg above the level of your heart.  Go back to your health care provider about 2 weeks after surgery to have your splint or cast removed. If you have stitches, your health care provider will also take them out during this time.  After your cast or splint is removed, your health care provider will give you instructions on how to move your ankle and how much weight you can put on your leg. Follow your health care provider's instructions.  See a physical therapist if directed by your health care provider. A physical therapist can help you regain ankle motion and strengthen your leg muscles.  Keep all follow-up appointments. SEEK MEDICAL CARE IF:   You have chills.  You have a fever.  Your pain medicine is  not working.  You have persistent numbness or burning. SEEK IMMEDIATE MEDICAL CARE IF:   You have pain or numbness that is getting worse.  You are unable to move your toes.  You are bleeding through your cast or splint.  You notice redness, swelling, bleeding, or discharge near your cut (incision) after your cast or splint has been removed.  You notice warmth, swelling, or tenderness in the back of your lower leg.  You have chest pain.  You have trouble breathing. MAKE SURE YOU:  Understand these instructions.  Will watch your condition.  Will get help right away if you are not doing well or get worse.   This information is not intended to replace advice given to you by your health care provider. Make sure you discuss any questions you have with your health care provider.   Document Released: 03/23/2004 Document Revised: 07/23/2013 Document Reviewed: 06/03/2013 Elsevier Interactive Patient Education [employer]2016 [employer] Inc[employer]

## 2015-10-02 NOTE — H&P (Signed)
Joseph Erickson is an 25 y.o. male.   Chief Complaint:  Left ankle pain, weakness HPI:   25 yo male who sustained an acute, complete achilles tendon rupture while playing basketball.  We have recommended a direct primary repair.  Risks and benefits have been thoroughly discussed.  Past Medical History  Diagnosis Date  . Asthma     as child, no recent problems  . Achilles tendon rupture     left    Past Surgical History  Procedure Laterality Date  . No past surgeries      History reviewed. No pertinent family history. Social History:  reports that he has never smoked. He has never used smokeless tobacco. He reports that he drinks alcohol. He reports that he does not use illicit drugs.  Allergies: No Known Allergies  No prescriptions prior to admission    No results found for this or any previous visit (from the past 48 hour(s)). No results found.  Review of Systems  All other systems reviewed and are negative.   There were no vitals taken for this visit. Physical Exam  Constitutional: He is oriented to person, place, and time. He appears well-developed and well-nourished.  HENT:  Head: Normocephalic and atraumatic.  Eyes: EOM are normal. Pupils are equal, round, and reactive to light.  Neck: Normal range of motion. Neck supple.  Cardiovascular: Normal rate and regular rhythm.   Respiratory: Effort normal and breath sounds normal.  GI: Soft. Bowel sounds are normal.  Musculoskeletal:       Left ankle: Achilles tendon exhibits pain, defect and abnormal Thompson's test results.  Neurological: He is alert and oriented to person, place, and time.  Skin: Skin is warm and dry.  Psychiatric: He has a normal mood and affect.     Assessment/Plan Left complete acute achilles tendon rupture 1)  To the OR today for a direct primary repair.  Joseph Erickson,Easten Maceachern Y, MD 10/02/2015, 7:29 AM

## 2015-10-02 NOTE — Anesthesia Preprocedure Evaluation (Signed)
Anesthesia Evaluation  Patient identified by MRN, date of birth, ID band Patient awake    Reviewed: Allergy & Precautions, NPO status , Patient's Chart, lab work & pertinent test results  Airway Mallampati: I  TM Distance: >3 FB Neck ROM: Full    Dental  (+) Teeth Intact, Dental Advisory Given   Pulmonary asthma ,    breath sounds clear to auscultation       Cardiovascular  Rhythm:Regular Rate:Normal     Neuro/Psych    GI/Hepatic   Endo/Other    Renal/GU      Musculoskeletal   Abdominal   Peds  Hematology   Anesthesia Other Findings   Reproductive/Obstetrics                             Anesthesia Physical Anesthesia Plan  ASA: II  Anesthesia Plan: General   Post-op Pain Management: MAC Combined w/ Regional for Post-op pain   Induction: Intravenous  Airway Management Planned: Oral ETT  Additional Equipment:   Intra-op Plan:   Post-operative Plan: Extubation in OR  Informed Consent: I have reviewed the patients History and Physical, chart, labs and discussed the procedure including the risks, benefits and alternatives for the proposed anesthesia with the patient or authorized representative who has indicated his/her understanding and acceptance.   Dental advisory given  Plan Discussed with: CRNA, Anesthesiologist and Surgeon  Anesthesia Plan Comments:         Anesthesia Quick Evaluation

## 2015-10-02 NOTE — Brief Op Note (Signed)
10/02/2015  4:36 PM  PATIENT:  Jan FiremanIsaiah Imber  25 y.o. male  PRE-OPERATIVE DIAGNOSIS:  left achilles tendon rupture  POST-OPERATIVE DIAGNOSIS:  left achilles tendon rupture  PROCEDURE:  Procedure(s): LEFT ACHILLES TENDON REPAIR (Left)  SURGEON:  Surgeon(s) and Role:    * Kathryne Hitchhristopher Y Nimsi Males, MD - Primary  PHYSICIAN ASSISTANT: Rexene EdisonGil Clark, PA-c  ANESTHESIA:   regional and general  EBL:  Total I/O In: 1000 [I.V.:1000] Out: -   COUNTS:  YES  TOURNIQUET:  * Missing tourniquet times found for documented tourniquets in log:  [id number] *  DICTATION: .Other Dictation: Dictation Number [id number]  PLAN OF CARE: Discharge to home after PACU  PATIENT DISPOSITION:  PACU - hemodynamically stable.   Delay start of Pharmacological VTE agent (>24hrs) due to surgical blood loss or risk of bleeding: no

## 2015-10-05 ENCOUNTER — Encounter (HOSPITAL_COMMUNITY): Payer: Self-pay | Admitting: Orthopaedic Surgery

## 2017-07-24 IMAGING — CR DG CHEST 2V
2 series · 2 of 2 positions shown · non-contrast
Comparison: None.

CLINICAL DATA: Sore throat.  Shortness of breath.  Dry cough.

EXAM:
CHEST  2 VIEW

[w chest pa]
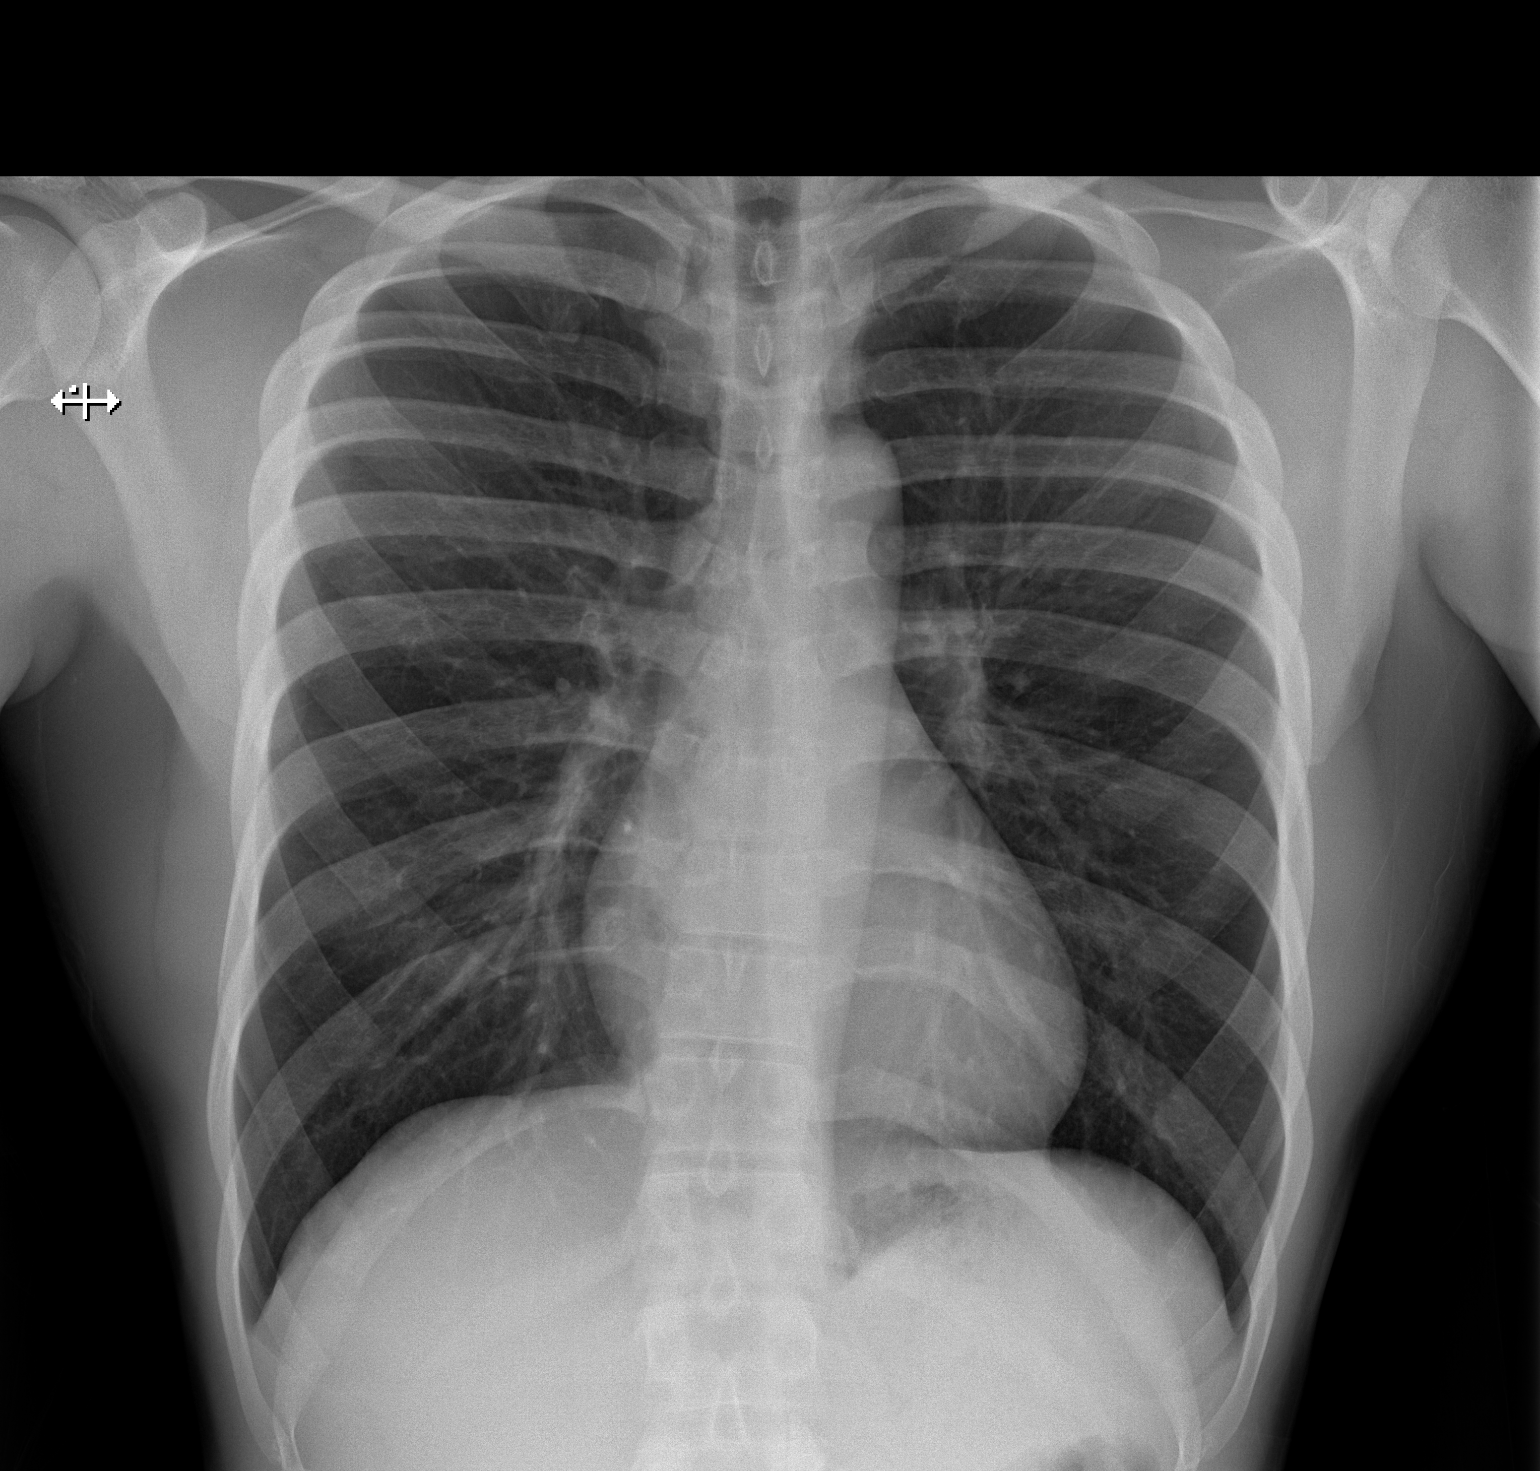

[w chest lat]
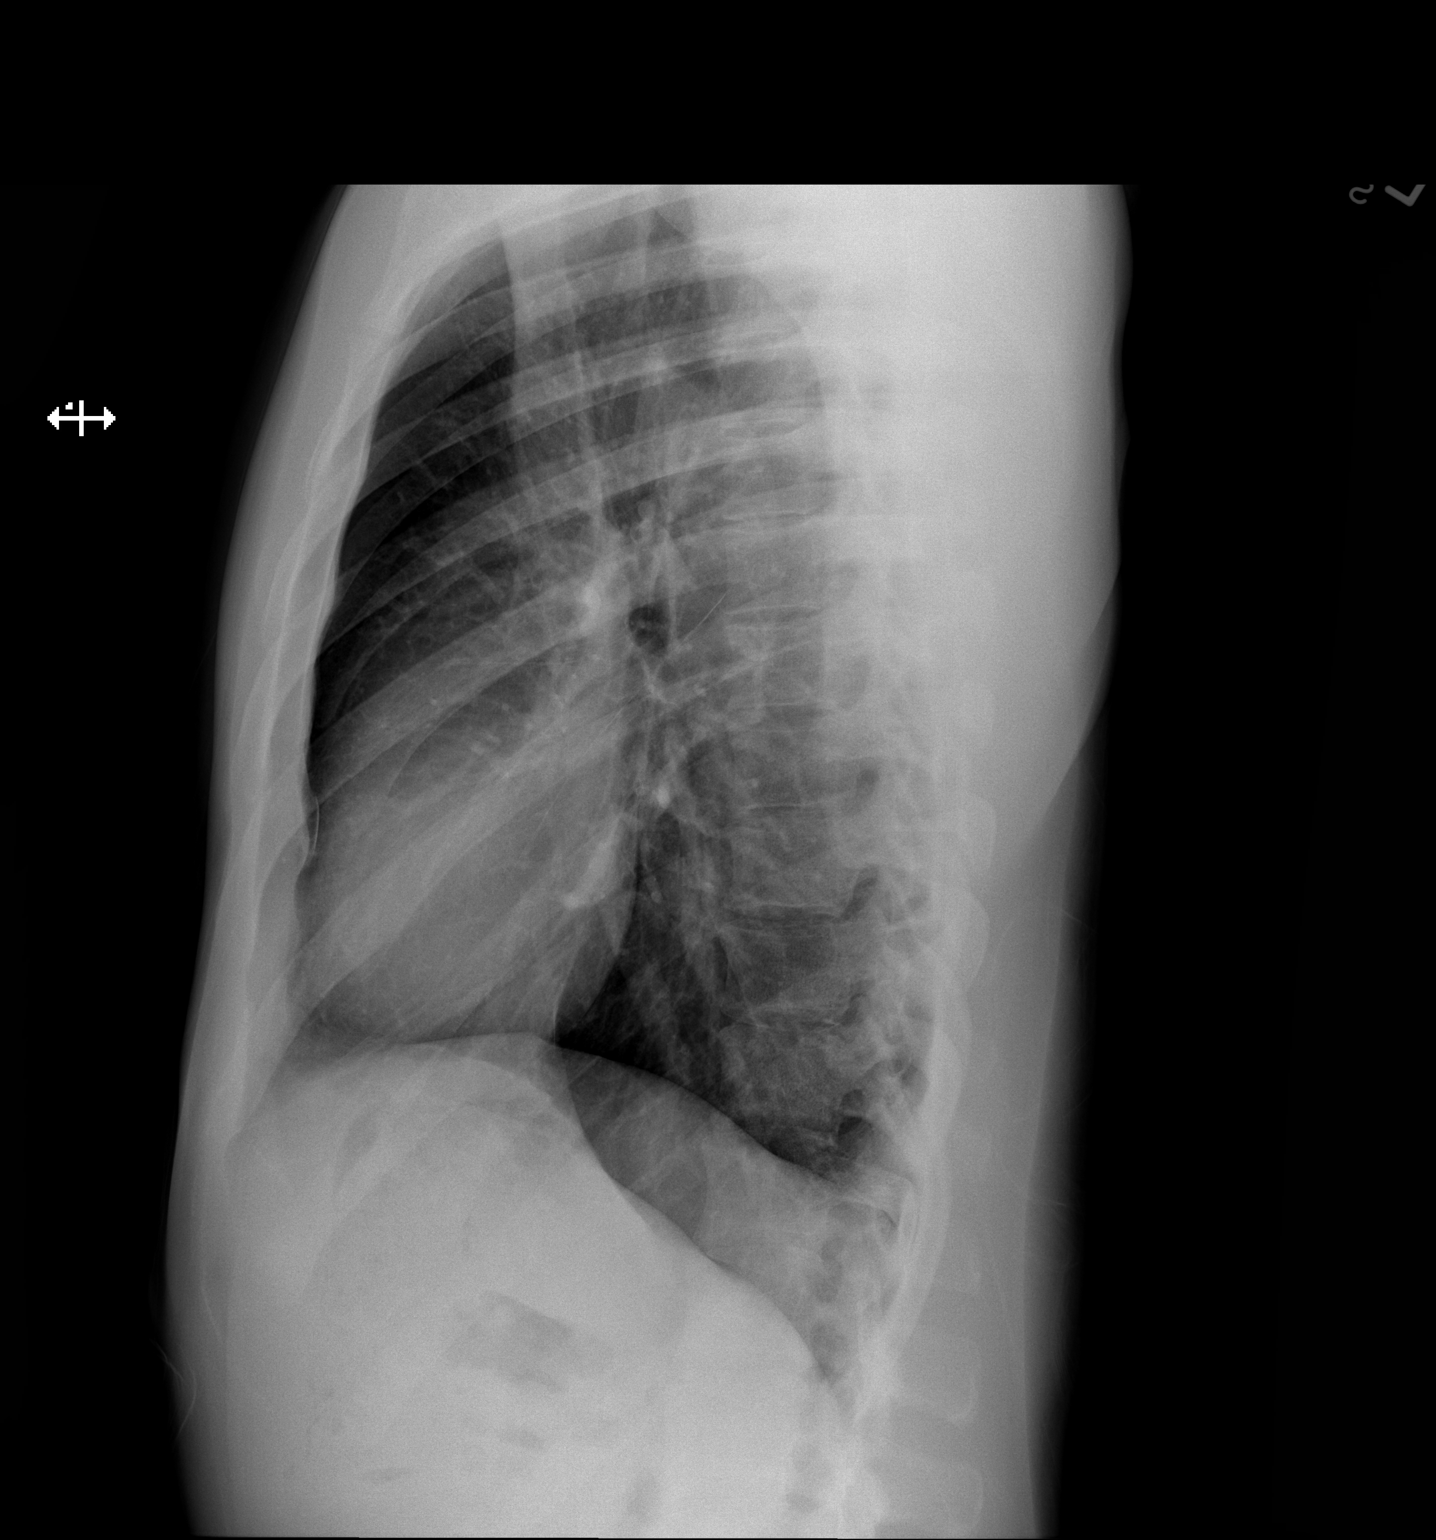

[2 of 2 positions shown; findings below may reference images not displayed]

FINDINGS: The heart size and mediastinal contours are within normal limits.
Both lungs are clear. The visualized skeletal structures are
unremarkable.
IMPRESSION: No active cardiopulmonary disease.

## 2017-07-24 IMAGING — CR DG NECK SOFT TISSUE
2 series · 2 of 2 positions shown · non-contrast
Comparison: None.

CLINICAL DATA: Globus sensation in the throat.  Visible epiglottis.

EXAM:
NECK SOFT TISSUES - 1+ VIEW

[w soft tissue neck lat]
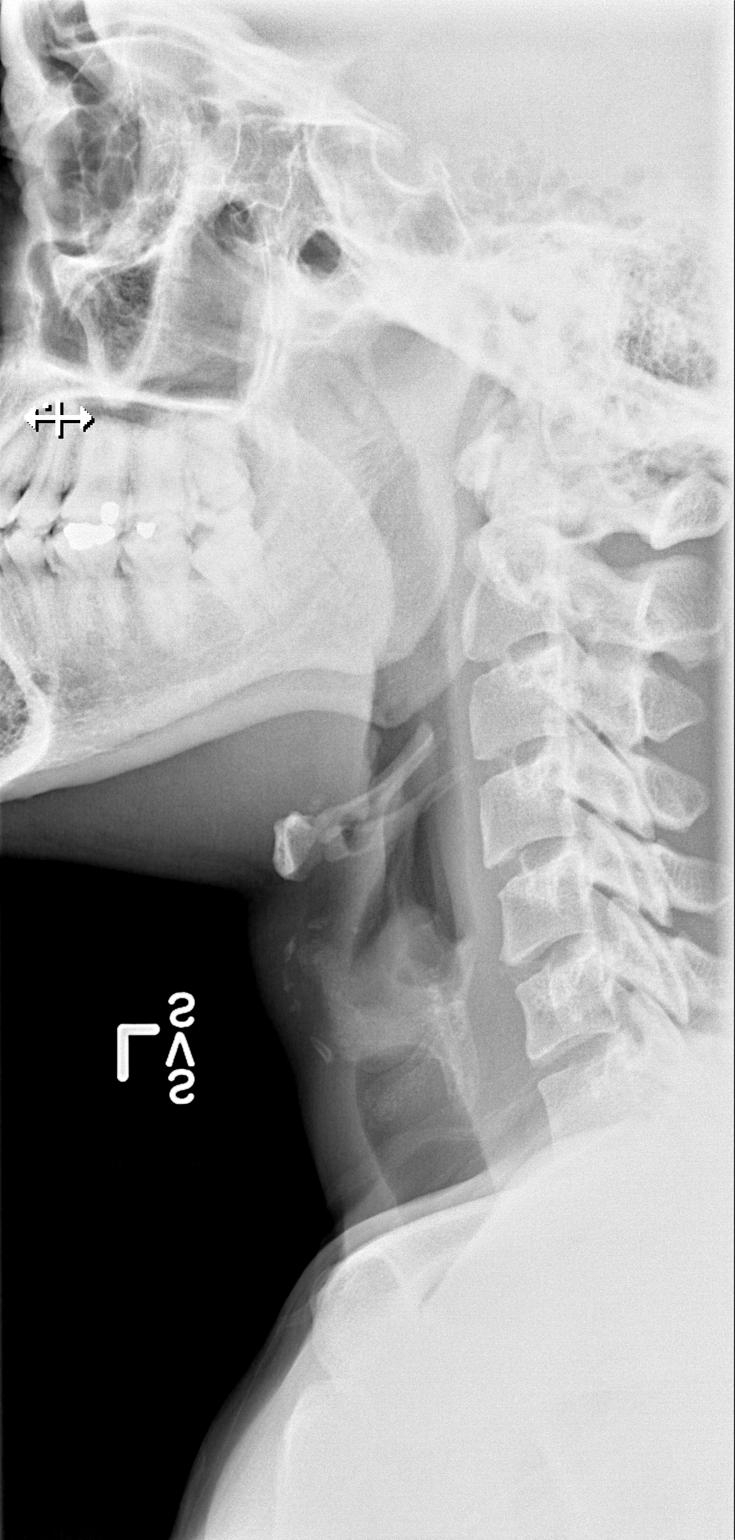

[w soft tissue neck ap]
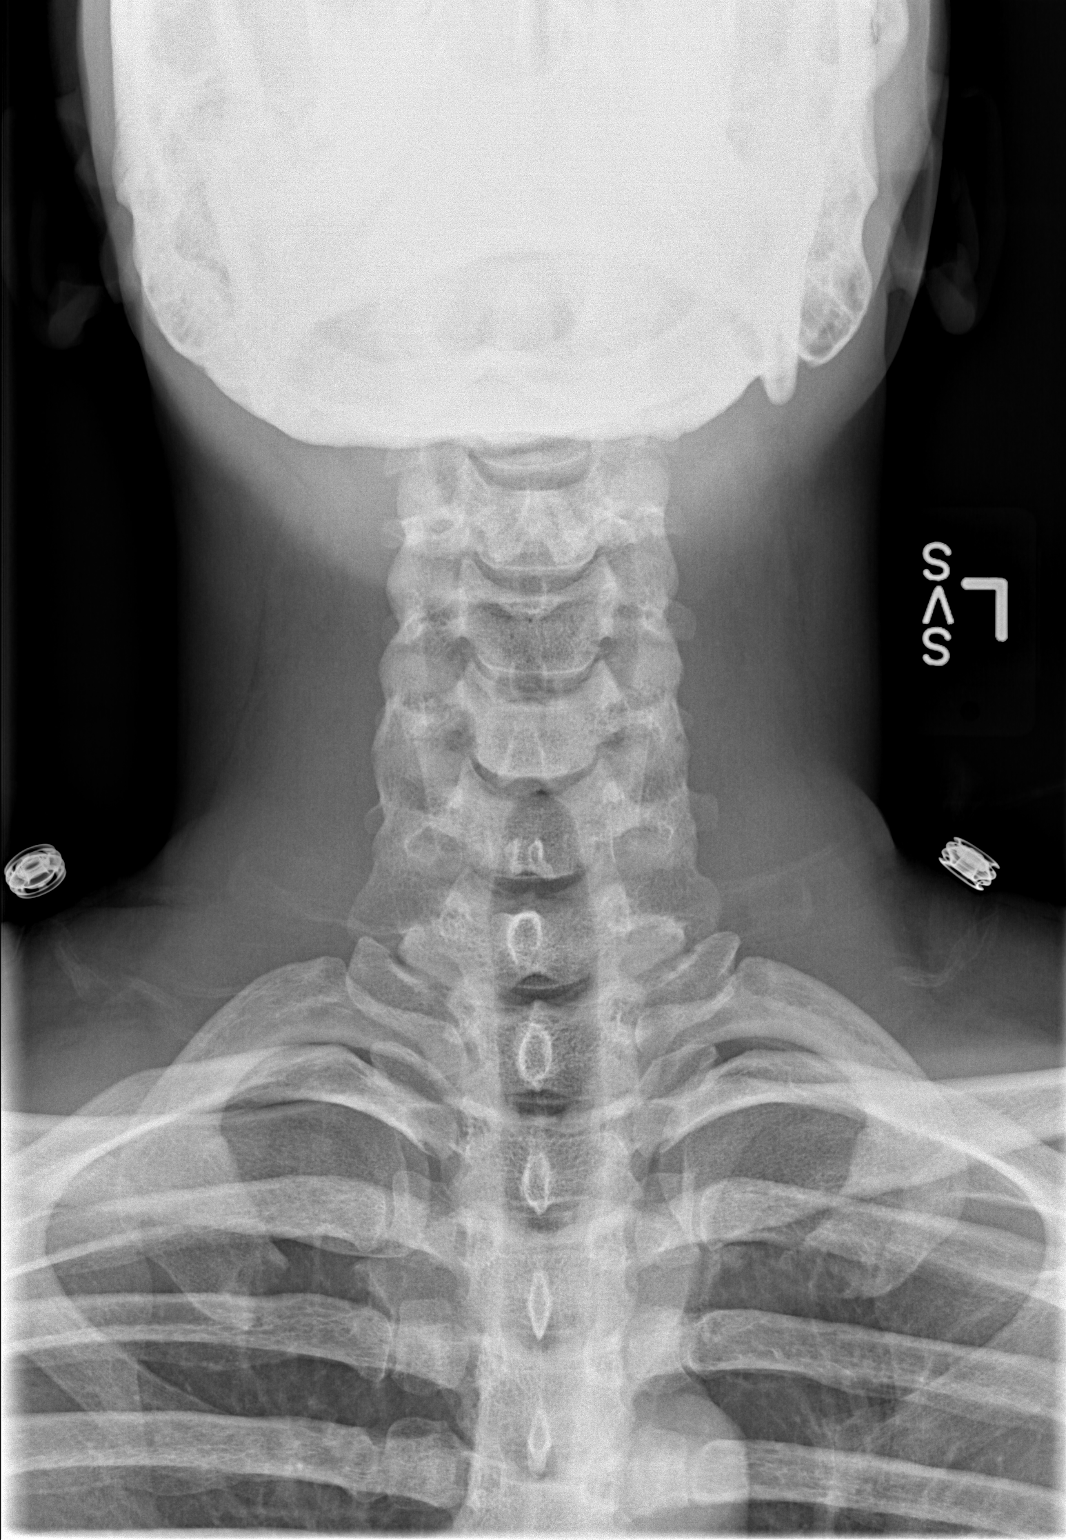

[2 of 2 positions shown; findings below may reference images not displayed]

FINDINGS: There is no evidence of retropharyngeal soft tissue swelling or
epiglottic enlargement. The cervical airway is unremarkable and no
radio-opaque foreign body identified.
IMPRESSION: Negative.

## 2017-08-16 ENCOUNTER — Ambulatory Visit (INDEPENDENT_AMBULATORY_CARE_PROVIDER_SITE_OTHER): Payer: No Typology Code available for payment source | Admitting: Family Medicine

## 2017-08-16 ENCOUNTER — Encounter: Payer: Self-pay | Admitting: Family Medicine

## 2017-08-16 VITALS — BP 124/82 | HR 76 | Temp 98.1°F | Resp 12 | Ht 66.0 in | Wt 191.0 lb

## 2017-08-16 DIAGNOSIS — E669 Obesity, unspecified: Secondary | ICD-10-CM | POA: Diagnosis not present

## 2017-08-16 DIAGNOSIS — R030 Elevated blood-pressure reading, without diagnosis of hypertension: Secondary | ICD-10-CM | POA: Diagnosis not present

## 2017-08-16 DIAGNOSIS — K120 Recurrent oral aphthae: Secondary | ICD-10-CM

## 2017-08-16 NOTE — Patient Instructions (Addendum)
A few things to remember from today's visit:   Elevated blood pressure reading  Canker sores oral  Orajel over the counter. Continue monitoring blood pressure at home.  Please be sure medication list is accurate. If a new problem present, please set up appointment sooner than planned today.

## 2017-08-16 NOTE — Progress Notes (Signed)
HPI:   Joseph Erickson is a 27 y.o. male, who is here today to establish care.  Former PCP: N/A Last preventive routine visit: 2018  Chronic medical problems: Elevated BP and asthma.  He exercises regularly. He is not consistent with a healthy diet.  According to patient, he was diagnosed with hypertension at age 16th.  Medication was recommended but he refused to take it. He monitors BP periodically and most of the time BP 120's/80's, occasionally 140/mid 90s  Denies severe/frequent headache, visual changes, chest pain, dyspnea, palpitation, claudication, focal weakness, or edema.  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   Concerns today: Canker sore Noted about 5-7 days ago, lesion was healing when he accidentally injured area with his toothbrush. He denies using any OTC medication. No history of recurrent oral lesions. He does not use tobacco products. He denies fever, chills, abnormal weight loss.  Lesion is mildly tender, exacerbated by food intake.  Review of Systems  Constitutional: Negative for activity change, appetite change, fatigue, fever and unexpected weight change.  HENT: Positive for mouth sores. Negative for nosebleeds, sore throat and trouble swallowing.   Eyes: Negative for redness and visual disturbance.  Respiratory: Negative for apnea, cough, shortness of breath and wheezing.   Cardiovascular: Negative for chest pain, palpitations and leg swelling.  Gastrointestinal: Negative for abdominal pain, nausea and vomiting.  Endocrine: Negative for cold intolerance and heat intolerance.  Genitourinary: Negative for decreased urine volume and hematuria.  Musculoskeletal: Negative for gait problem and myalgias.  Skin: Negative for pallor and rash.  Neurological: Negative for dizziness, weakness and headaches.  Hematological: Negative for adenopathy. Does  not bruise/bleed easily.  Psychiatric/Behavioral: Negative for confusion. The patient is not nervous/anxious.       Current Outpatient Medications on File Prior to Visit  Medication Sig Dispense Refill  . aspirin EC 325 MG tablet Take 1 tablet (325 mg total) by mouth daily. (Patient not taking: Reported on 08/16/2017) 30 tablet 0  . ibuprofen (ADVIL,MOTRIN) 200 MG tablet Take 200-800 mg by mouth every 6 (six) hours as needed for moderate pain.     Marland Kitchen. oxyCODONE-acetaminophen (ROXICET) 5-325 MG tablet Take 1-2 tablets by mouth every 4 (four) hours as needed. (Patient not taking: Reported on 08/16/2017) 60 tablet 0   No current facility-administered medications on file prior to visit.      Past Medical History:  Diagnosis Date  . Achilles tendon rupture    left  . Asthma    as child, no recent problems  . Hypertension    No Known Allergies  Family History  Problem Relation Age of Onset  . Diabetes Maternal Grandmother     Social History   Socioeconomic History  . Marital status: Married    Spouse name: None  . Number of children: None  . Years of education: None  . Highest education level: None  Social Needs  . Financial resource strain: None  . Food insecurity - worry: None  . Food insecurity - inability: None  . Transportation needs - medical: None  . Transportation needs - non-medical: None  Occupational History  . None  Tobacco Use  . Smoking status: Former Smoker    Types: Cigarettes  . Smokeless tobacco: Never Used  Substance and Sexual Activity  . Alcohol use: Yes    Alcohol/week: 0.0 oz    Comment: rarely  .  Drug use: No  . Sexual activity: Yes    Partners: Female  Other Topics Concern  . None  Social History Narrative  . None    Vitals:   08/16/17 0854  BP: 124/82  Pulse: 76  Resp: 12  Temp: 98.1 F (36.7 C)  SpO2: 98%    Body mass index is 30.83 kg/m.   Physical Exam  Nursing note and vitals reviewed. Constitutional: He is oriented  to person, place, and time. He appears well-developed. No distress.  HENT:  Head: Normocephalic and atraumatic.  Mouth/Throat: Oropharynx is clear and moist and mucous membranes are normal.  6 mm mildly raised rounded lesion on mucosa of right cheek.  Eyes: Conjunctivae are normal. Pupils are equal, round, and reactive to light.  Neck: No tracheal deviation present. No thyroid mass and no thyromegaly present.  Cardiovascular: Normal rate and regular rhythm.  No murmur heard. Pulses:      Dorsalis pedis pulses are 2+ on the right side, and 2+ on the left side.  Respiratory: Effort normal and breath sounds normal. No respiratory distress.  GI: Soft. He exhibits no mass. There is no hepatomegaly. There is no tenderness.  Musculoskeletal: He exhibits no edema or tenderness.  Lymphadenopathy:       Head (right side): No submental and no submandibular adenopathy present.       Head (left side): No submental and no submandibular adenopathy present.    He has no cervical adenopathy.  Neurological: He is alert and oriented to person, place, and time. He has normal strength. Gait normal.  Skin: Skin is warm. No rash noted. No erythema.  Psychiatric: He has a normal mood and affect. Cognition and memory are normal.  Well groomed, good eye contact.    ASSESSMENT AND PLAN:   Mr. Joseph Erickson was seen today for establish care.  Diagnoses and all orders for this visit:  Elevated blood pressure reading  BP adequate today and he is reporting most home readings in normal range. He is not interested in blood work today. Recommend low-salt diet. Avoid NSAIDs. Continue monitoring BP at home. Instructed about warning signs.  Canker sores oral  Non recurrent. OTC Orajel recommended. Follow-up as needed.  Obesity with body mass index (BMI) of 30.0 to 39.9  We discussed benefits of wt loss as well as adverse effects of obesity. Consistency with healthy diet and physical activity  recommended.     Shelton Soler G. [place], MD  [facility]. [place] office.
